# Patient Record
Sex: Male | Born: 1935 | Race: Black or African American | Hispanic: No | Smoking: Former smoker
Health system: Southern US, Community
[De-identification: ages and names within clinical notes are randomized; demographics above are authoritative.]

## PROBLEM LIST (undated history)

## (undated) DIAGNOSIS — M797 Fibromyalgia: Secondary | ICD-10-CM

## (undated) DIAGNOSIS — R350 Frequency of micturition: Secondary | ICD-10-CM

## (undated) DIAGNOSIS — M1990 Unspecified osteoarthritis, unspecified site: Secondary | ICD-10-CM

## (undated) DIAGNOSIS — N189 Chronic kidney disease, unspecified: Secondary | ICD-10-CM

## (undated) DIAGNOSIS — E785 Hyperlipidemia, unspecified: Secondary | ICD-10-CM

## (undated) DIAGNOSIS — I2510 Atherosclerotic heart disease of native coronary artery without angina pectoris: Secondary | ICD-10-CM

## (undated) DIAGNOSIS — I10 Essential (primary) hypertension: Secondary | ICD-10-CM

## (undated) DIAGNOSIS — N400 Enlarged prostate without lower urinary tract symptoms: Secondary | ICD-10-CM

## (undated) DIAGNOSIS — D759 Disease of blood and blood-forming organs, unspecified: Secondary | ICD-10-CM

## (undated) DIAGNOSIS — I519 Heart disease, unspecified: Secondary | ICD-10-CM

## (undated) DIAGNOSIS — C801 Malignant (primary) neoplasm, unspecified: Secondary | ICD-10-CM

## (undated) DIAGNOSIS — K429 Umbilical hernia without obstruction or gangrene: Secondary | ICD-10-CM

## (undated) DIAGNOSIS — R0989 Other specified symptoms and signs involving the circulatory and respiratory systems: Secondary | ICD-10-CM

## (undated) HISTORY — PX: COLECTOMY: SHX59

## (undated) HISTORY — PX: CORONARY ARTERY BYPASS GRAFT: SHX141

## (undated) HISTORY — PX: OTHER SURGICAL HISTORY: SHX169

---

## 2008-01-01 ENCOUNTER — Encounter: Payer: Self-pay | Admitting: Orthopedic Surgery

## 2009-11-20 ENCOUNTER — Ambulatory Visit: Admission: RE | Admit: 2009-11-20 | Discharge: 2010-01-21 | Payer: Self-pay | Admitting: Radiation Oncology

## 2011-09-15 ENCOUNTER — Encounter: Payer: Self-pay | Admitting: Internal Medicine

## 2011-09-15 DIAGNOSIS — N189 Chronic kidney disease, unspecified: Secondary | ICD-10-CM

## 2011-09-15 DIAGNOSIS — C20 Malignant neoplasm of rectum: Secondary | ICD-10-CM

## 2011-10-05 ENCOUNTER — Ambulatory Visit (INDEPENDENT_AMBULATORY_CARE_PROVIDER_SITE_OTHER): Payer: Medicaid Other | Admitting: Urology

## 2011-10-05 DIAGNOSIS — Z125 Encounter for screening for malignant neoplasm of prostate: Secondary | ICD-10-CM

## 2011-10-05 DIAGNOSIS — N390 Urinary tract infection, site not specified: Secondary | ICD-10-CM

## 2011-10-05 DIAGNOSIS — N400 Enlarged prostate without lower urinary tract symptoms: Secondary | ICD-10-CM

## 2011-12-08 DIAGNOSIS — R079 Chest pain, unspecified: Secondary | ICD-10-CM

## 2011-12-09 DIAGNOSIS — I200 Unstable angina: Secondary | ICD-10-CM

## 2011-12-09 DIAGNOSIS — R9389 Abnormal findings on diagnostic imaging of other specified body structures: Secondary | ICD-10-CM

## 2011-12-10 ENCOUNTER — Encounter (HOSPITAL_COMMUNITY)
Admission: AD | Disposition: A | Discharge status: Home / Self Care | Payer: Self-pay | Source: Other Acute Inpatient Hospital | Attending: Internal Medicine

## 2011-12-10 ENCOUNTER — Inpatient Hospital Stay (HOSPITAL_COMMUNITY)
Admission: AD | Admit: 2011-12-10 | Discharge: 2011-12-11 | DRG: 287 | Disposition: A | Discharge status: Home / Self Care | Payer: PRIVATE HEALTH INSURANCE | Source: Other Acute Inpatient Hospital | Attending: Internal Medicine | Admitting: Internal Medicine

## 2011-12-10 ENCOUNTER — Ambulatory Visit (HOSPITAL_COMMUNITY): Admit: 2011-12-10 | Payer: Self-pay | Admitting: Cardiology

## 2011-12-10 ENCOUNTER — Encounter (HOSPITAL_COMMUNITY): Payer: Self-pay | Admitting: General Practice

## 2011-12-10 ENCOUNTER — Other Ambulatory Visit: Payer: Self-pay | Admitting: Physician Assistant

## 2011-12-10 DIAGNOSIS — Z7982 Long term (current) use of aspirin: Secondary | ICD-10-CM

## 2011-12-10 DIAGNOSIS — D72819 Decreased white blood cell count, unspecified: Secondary | ICD-10-CM | POA: Diagnosis present

## 2011-12-10 DIAGNOSIS — I25810 Atherosclerosis of coronary artery bypass graft(s) without angina pectoris: Secondary | ICD-10-CM | POA: Diagnosis present

## 2011-12-10 DIAGNOSIS — I200 Unstable angina: Secondary | ICD-10-CM

## 2011-12-10 DIAGNOSIS — Z79899 Other long term (current) drug therapy: Secondary | ICD-10-CM

## 2011-12-10 DIAGNOSIS — I129 Hypertensive chronic kidney disease with stage 1 through stage 4 chronic kidney disease, or unspecified chronic kidney disease: Secondary | ICD-10-CM | POA: Diagnosis present

## 2011-12-10 DIAGNOSIS — I2582 Chronic total occlusion of coronary artery: Secondary | ICD-10-CM | POA: Diagnosis present

## 2011-12-10 DIAGNOSIS — N400 Enlarged prostate without lower urinary tract symptoms: Secondary | ICD-10-CM | POA: Diagnosis present

## 2011-12-10 DIAGNOSIS — N189 Chronic kidney disease, unspecified: Secondary | ICD-10-CM | POA: Diagnosis present

## 2011-12-10 DIAGNOSIS — Z87891 Personal history of nicotine dependence: Secondary | ICD-10-CM

## 2011-12-10 DIAGNOSIS — I739 Peripheral vascular disease, unspecified: Secondary | ICD-10-CM

## 2011-12-10 DIAGNOSIS — Z85048 Personal history of other malignant neoplasm of rectum, rectosigmoid junction, and anus: Secondary | ICD-10-CM

## 2011-12-10 DIAGNOSIS — F1011 Alcohol abuse, in remission: Secondary | ICD-10-CM | POA: Diagnosis present

## 2011-12-10 DIAGNOSIS — Z932 Ileostomy status: Secondary | ICD-10-CM

## 2011-12-10 DIAGNOSIS — I509 Heart failure, unspecified: Secondary | ICD-10-CM | POA: Diagnosis present

## 2011-12-10 DIAGNOSIS — S68118A Complete traumatic metacarpophalangeal amputation of other finger, initial encounter: Secondary | ICD-10-CM

## 2011-12-10 DIAGNOSIS — D696 Thrombocytopenia, unspecified: Secondary | ICD-10-CM | POA: Diagnosis present

## 2011-12-10 DIAGNOSIS — I2510 Atherosclerotic heart disease of native coronary artery without angina pectoris: Secondary | ICD-10-CM

## 2011-12-10 DIAGNOSIS — I2589 Other forms of chronic ischemic heart disease: Secondary | ICD-10-CM | POA: Diagnosis present

## 2011-12-10 DIAGNOSIS — IMO0001 Reserved for inherently not codable concepts without codable children: Secondary | ICD-10-CM | POA: Diagnosis present

## 2011-12-10 HISTORY — DX: Unspecified osteoarthritis, unspecified site: M19.90

## 2011-12-10 HISTORY — DX: Malignant (primary) neoplasm, unspecified: C80.1

## 2011-12-10 HISTORY — DX: Hyperlipidemia, unspecified: E78.5

## 2011-12-10 HISTORY — DX: Disease of blood and blood-forming organs, unspecified: D75.9

## 2011-12-10 HISTORY — PX: LEFT HEART CATHETERIZATION WITH CORONARY/GRAFT ANGIOGRAM: SHX5450

## 2011-12-10 HISTORY — DX: Other specified symptoms and signs involving the circulatory and respiratory systems: R09.89

## 2011-12-10 HISTORY — DX: Benign prostatic hyperplasia without lower urinary tract symptoms: N40.0

## 2011-12-10 HISTORY — DX: Atherosclerotic heart disease of native coronary artery without angina pectoris: I25.10

## 2011-12-10 HISTORY — DX: Heart disease, unspecified: I51.9

## 2011-12-10 HISTORY — DX: Fibromyalgia: M79.7

## 2011-12-10 HISTORY — DX: Chronic kidney disease, unspecified: N18.9

## 2011-12-10 HISTORY — DX: Essential (primary) hypertension: I10

## 2011-12-10 SURGERY — LEFT HEART CATHETERIZATION WITH CORONARY/GRAFT ANGIOGRAM
Anesthesia: LOCAL

## 2011-12-10 MED ORDER — ACETAMINOPHEN 325 MG PO TABS: 650.0000 mg | ORAL_TABLET | ORAL | Status: DC | PRN

## 2011-12-10 MED ORDER — SODIUM CHLORIDE 0.9 % IJ SOLN: 3.0000 mL | INTRAMUSCULAR | Status: DC | PRN

## 2011-12-10 MED ORDER — SIMVASTATIN 40 MG PO TABS
40.0000 mg | ORAL_TABLET | Freq: Every day | ORAL | Status: DC
  Administered 2011-12-10: 40 mg via ORAL
  Filled 2011-12-10 (×2): qty 1

## 2011-12-10 MED ORDER — SODIUM CHLORIDE 0.9 % IV SOLN: 250.0000 mL | INTRAVENOUS | Status: DC | PRN

## 2011-12-10 MED ORDER — METOPROLOL TARTRATE 25 MG PO TABS
25.0000 mg | ORAL_TABLET | Freq: Two times a day (BID) | ORAL | Status: DC
  Administered 2011-12-10 – 2011-12-11 (×3): 25 mg via ORAL
  Filled 2011-12-10 (×4): qty 1

## 2011-12-10 MED ORDER — ONDANSETRON HCL 4 MG/2ML IJ SOLN: 4.0000 mg | Freq: Four times a day (QID) | INTRAMUSCULAR | Status: DC | PRN

## 2011-12-10 MED ORDER — DIAZEPAM 5 MG PO TABS
5.0000 mg | ORAL_TABLET | ORAL | Status: AC
  Administered 2011-12-10: 5 mg via ORAL
  Filled 2011-12-10: qty 1

## 2011-12-10 MED ORDER — LIDOCAINE HCL (PF) 1 % IJ SOLN
INTRAMUSCULAR | Status: AC
  Filled 2011-12-10: qty 30

## 2011-12-10 MED ORDER — NITROGLYCERIN 0.4 MG SL SUBL: 0.4000 mg | SUBLINGUAL_TABLET | SUBLINGUAL | Status: DC | PRN

## 2011-12-10 MED ORDER — MIDAZOLAM HCL 2 MG/2ML IJ SOLN
INTRAMUSCULAR | Status: AC
  Filled 2011-12-10: qty 2

## 2011-12-10 MED ORDER — HEPARIN (PORCINE) IN NACL 2-0.9 UNIT/ML-% IJ SOLN
INTRAMUSCULAR | Status: AC
  Filled 2011-12-10: qty 2000

## 2011-12-10 MED ORDER — ASPIRIN 81 MG PO CHEW: 324.0000 mg | CHEWABLE_TABLET | ORAL | Status: DC

## 2011-12-10 MED ORDER — SODIUM CHLORIDE 0.9 % IV SOLN
INTRAVENOUS | Status: DC
  Administered 2011-12-10: 23:00:00 via INTRAVENOUS

## 2011-12-10 MED ORDER — SODIUM CHLORIDE 0.9 % IJ SOLN: 3.0000 mL | Freq: Two times a day (BID) | INTRAMUSCULAR | Status: DC

## 2011-12-10 MED ORDER — SODIUM CHLORIDE 0.9 % IV SOLN
INTRAVENOUS | Status: DC
  Administered 2011-12-10: 13:00:00 via INTRAVENOUS

## 2011-12-10 MED ORDER — LORATADINE 10 MG PO TABS
10.0000 mg | ORAL_TABLET | Freq: Every day | ORAL | Status: DC
  Administered 2011-12-10 – 2011-12-11 (×2): 10 mg via ORAL
  Filled 2011-12-10 (×2): qty 1

## 2011-12-10 MED ORDER — NITROGLYCERIN 0.2 MG/ML ON CALL CATH LAB
INTRAVENOUS | Status: AC
  Filled 2011-12-10: qty 1

## 2011-12-10 MED ORDER — TAMSULOSIN HCL 0.4 MG PO CAPS
0.4000 mg | ORAL_CAPSULE | Freq: Two times a day (BID) | ORAL | Status: DC
  Administered 2011-12-10 – 2011-12-11 (×3): 0.4 mg via ORAL
  Filled 2011-12-10 (×4): qty 1

## 2011-12-10 MED ORDER — ASPIRIN EC 81 MG PO TBEC
81.0000 mg | DELAYED_RELEASE_TABLET | Freq: Every day | ORAL | Status: DC
  Administered 2011-12-11: 81 mg via ORAL
  Filled 2011-12-10: qty 1

## 2011-12-10 NOTE — CV Procedure (Signed)
  Cardiac Catheterization Procedure Note  Name: Brian Becker MRN: [id number] DOB: 1935-08-03  Procedure: Left Heart Cath, Selective Coronary Angiography, LV angiography  Indication:   CAD with a distant history of CABG.  He his an ischemic cardiomyopathy.  He has had chest pain and a stress echo that demonstrated LV enlargement and hypokinesis of the anterior and inferior walls.    Procedural details: The right groin was prepped, draped, and anesthetized with 1% lidocaine. Using modified Seldinger technique, a 5 French sheath was introduced into the right femoral artery. Standard Judkins catheters were used for coronary angiography and left ventriculography. I had some difficulty advancing the guidewire in the right iliac artery.  I was able to get past this obstruction with a Glide wire.  A distal aortogram was obtained at the end of the case to evaluate the degree of PVD. Catheter exchanges were performed over a guidewire. There were no immediate procedural complications. The patient was transferred to the post catheterization recovery area for further monitoring.  Procedural Findings:   Hemodynamics:     AO 144/66    LV 152/10   Coronary angiography:   Coronary dominance: Right  Left mainstem:   Normal  Left anterior descending (LAD):   Proximal 25%.  Mid long 50 - 60%.  Distal long 60% and apical long 80%.  D1 and D2 small.  D1 with ostial 80% and long diffuse 50%.  D2 luminal irregularities.  Left circumflex (LCx):  100% after small OM1.  OM2 large and occluded at the ostium.  Fills via SVG.  Right coronary artery (RCA):  100 proximal occlusion.  Grafts:  LIMA to OM:  Widely patent  SVG to RCA:   Occluded  Distal aortogram:  Diffuse nonobstructive plaque.  Left ventriculography: LVEF is estimated at 35% with inferior akinesis, there is no significant mitral regurgitation   Final Conclusions:  Severe native 3 vessel CAD with 1 of 2 bypass grafts patent. Moderately severe  LV dysfunction.  Moderate   Recommendations: Medical management.    Rollene Rotunda 12/10/2011, 4:02 PM

## 2011-12-10 NOTE — H&P (Signed)
NAME:  Brian Becker, Brian Becker ROOM: 424  UNIT NUMBER:  [id number] LOCATION: [id number] 01 ADM/VISIT DATE:  12/08/2011   ADM Vaughan BrownerKathaleen Grinder:  [id number] DOB: 06/25/1935   PRIMARY CARDIOLOGIST:  Lewayne Bunting, M.D. (new).  REFERRING PHYSICIAN:  Wende Crease, M.D., [facility] hospitalist.  REASON FOR CONSULTATION:  Abnormal exercise stress echocardiogram.  HISTORY OF PRESENT ILLNESS:  Brian Becker is a very pleasant 76 year old African American male, with remote history of CABG, but who has not had any subsequent regular cardiology followup.  He relocated to [place] approximately 4 years ago from [place], [place], where he had 2-vessel CABG in 1986.  Patient was admitted yesterday, through the emergency room, following presentation with complaint of exertional angina.  This occurred yesterday morning, sometime after he had gone for a walk.  He referred to it as "tightness" (10/2008), with no radiation to the jaw or upper extremities.  He called EMS, took one NTG, and had a second tablet from EMS, with subsequent complete relief of his symptoms.  He reportedly was pain-free on arrival to the ED, where he presented with a blood pressure of 125/78, pulse 78, and was afebrile.  He was placed on nitro paste, and has not had any recurrent chest discomfort.  One set of cardiac markers was drawn and negative.  Patient was then referred for an exercise stress echocardiogram earlier today, reviewed by Dr. Andee Lineman, during which he reported no chest pain.  The study was adequate (90% PMHR), and patient achieved a level of 4 METS.  He denied any chest pain during exercise, but was found to have ST segment changes.  He also had nonsustained ventricular tachycardia, and echocardiographic images were consistent with ischemia; EF 45% to 50% at rest.  ALLERGIES:  MORPHINE.  HOME MEDICATIONS: 1. Aspirin 81 daily. 2. Atenolol 25 daily. 3. Cetirizine 10 mg p.r.n. 4. Flomax 0.4 mg b.i.d. 5. Lortab 10/500 mg  p.r.n. 6. Maxzide 25/25 mg daily. 7. Pravastatin 40 mg daily. 8. Proscar 5 mg daily.  PAST MEDICAL HISTORY: 1. Multivessel CAD. a. Two-vessel CABG, [address] [place], [place]). 2. HTN. 3. HLD. 4. Rectal adenocarcinoma (T3N0M0). a. Status post low anterior resection/temporary loop ileostomy, 2011 [facility]). b. Status post preoperative chemoradiation therapy. 5. Mild leukopenia. 6. Chronic renal medical disease. 7. Mild thrombocytopenia. 8. BPH.  PAST SURGICAL HISTORY:  CABG, left thoracic outlet surgery, right middle finger amputation (infection), colon surgery (as noted), and alcohol abuse.  SOCIAL HISTORY:  Patient relocated here to [facility] approximately 4 years ago.  He lives alone.  He is retired from a Technical sales engineer.  He quit smoking approximately 2 years ago, and started at the age of 31.  Quit drinking 4 years ago, with prior history of heavy use.  FAMILY HISTORY:  Noncontributory.  REVIEW OF SYSTEMS:  Denies any recent development of exertional angina or dyspnea, orthopnea, PND, tachy palpitations, or reflux symptoms.  Has had some mild chronic edema.  Denies history of diabetes mellitus.  Remaining systems reviewed are negative.  PHYSICAL EXAMINATION:  Vital signs:  Blood pressure currently 119/71, pulse ranging 65-85, regular; respirations 20, temperature afebrile, sats 96% on room air.  Weight 211 pounds.  General:  A 76 year old male sitting upright, in no distress.  HEENT:  Normocephalic, atraumatic.  PERRLA.  EOMI.  Neck:  Palpable bilateral carotid pulses with soft bilateral  bruits (right greater than left); no JVD.  Lungs:  Clear to auscultation in all fields.  Heart:  Regular rate and rhythm.  Soft,  grade 2/6 short systolic ejection murmur at the base.  Abdomen:  Protuberant.  Intact bowel sounds.  Extremities:  Palpable bilateral femoral pulses with soft right-sided bruits; palpable bilateral dorsalis pedis pulses; no significant peripheral edema.  Skin:   Warm and dry.  Musculoskeletal:  No obvious deformity.  Neurologic:  No focal deficits.  IMAGING DATA: 1. Admission chest x-ray:  Post CABG changes; no pleural effusion. 2. Admission EKG:  Normal sinus rhythm at 64 BPM; normal axis; LVH strain, isolated PVCs.  LABORATORY DATA:  CPK 132/2.1, troponin I 0.02, BNP 70, INR 1.1.  Sodium 138, potassium 3.8, BUN 12, creatinine 1.1, glucose 126.  WBC 2700, hemoglobin 13, hematocrit 41, platelet count 124,000.  IMPRESSION: 1. Unstable angina pectoris. a. Abnormal adequate exercise stress echocardiogram study b. Ejection fraction 45% to 50%. 2. Multivessel coronary artery disease. a. Two-vessel coronary artery bypass graft, 1986 [place], [place] 3. Ventricular ectopy. 4. Hypertension. 5. History of tobacco. 6. Carotid bruits, bilateral. 7. History of ethanol abuse.  PLAN:  Recommendation is to proceed with diagnostic coronary angiography and possible percutaneous intervention.  Arrangements will be made for the patient to be transferred in the morning to the cardiac catheterization lab.  Patient is in agreement with this recommendation, and the risks/benefits were discussed in conjunction with Dr. Andee Lineman.  Following hospitalization, recommendation is that the patient establish with [place] here in the [facility] clinic for regular cardiology follow up.  Please refer to Dr. Margarita Mail addendum note for complete details.  Brian Becker, P.A., dictating for Lewayne Bunting, M.D.   __________________________    Brian Becker, P.A. Brian Becker D: 12/09/2011 1739 T: 12/10/2011 0153 P: SER2  The patient understands that risks included but are not limited to stroke (1 in 1000), death (1 in 1000), kidney failure [usually temporary] (1 in 500), bleeding (1 in 200), allergic reaction [possibly serious] (1 in 200).  The patient understands and agrees to proceed.   I have reviewed the findings above and examined the patient and agree.  Cath indicated.

## 2011-12-11 ENCOUNTER — Encounter (HOSPITAL_COMMUNITY): Payer: Self-pay | Admitting: Cardiology

## 2011-12-11 DIAGNOSIS — I200 Unstable angina: Secondary | ICD-10-CM

## 2011-12-11 LAB — BASIC METABOLIC PANEL
BUN: 13 mg/dL (ref 6–23)
Chloride: 108 mEq/L (ref 96–112)
Creatinine, Ser: 1.09 mg/dL (ref 0.50–1.35)
GFR calc Af Amer: 75 mL/min — ABNORMAL LOW (ref >90)
GFR calc non Af Amer: 65 mL/min — ABNORMAL LOW (ref >90)
Glucose, Bld: 94 mg/dL (ref 70–99)

## 2011-12-11 LAB — LIPID PANEL
Cholesterol: 127 mg/dL (ref 0–200)
Total CHOL/HDL Ratio: 4.1 RATIO
VLDL: 21 mg/dL (ref 0–40)

## 2011-12-11 MED ORDER — NITROGLYCERIN 0.4 MG SL SUBL: 0.4000 mg | SUBLINGUAL_TABLET | SUBLINGUAL | Status: DC | PRN

## 2011-12-11 MED ORDER — RAMIPRIL 2.5 MG PO CAPS: 2.5000 mg | ORAL_CAPSULE | Freq: Every day | ORAL | Status: DC

## 2011-12-11 MED ORDER — RAMIPRIL 2.5 MG PO CAPS
2.5000 mg | ORAL_CAPSULE | Freq: Every day | ORAL | Status: DC
  Administered 2011-12-11: 2.5 mg via ORAL
  Filled 2011-12-11: qty 1

## 2011-12-11 MED ORDER — METOPROLOL TARTRATE 25 MG PO TABS: 25.0000 mg | ORAL_TABLET | Freq: Two times a day (BID) | ORAL | Status: DC

## 2011-12-11 NOTE — Discharge Summary (Signed)
Discharge Summary   Patient ID: Brian Becker MRN: [id number], DOB/AGE: 74-Jul-1937 74 y.o.  Primary MD:  Primary Cardiologist: Dr. Andee Lineman Admit date: 12/10/2011 D/C date:     12/11/2011      Primary Discharge Diagnoses:  1. Unstable Angina/Coronary Artery Disease  - H/o 2v CABG 1986  - Cath 12/10/11 severe native 3v CAD with patent LAD to LIMA and occluded SVG to RCA, EF 35% w/ inf akinesis; medical therapy  2. LV dysfunction  - EF 35% by cath  - Low dose ACEI added this admission  - Consider follow up BMET  3. Bilateral Carotid Bruits  - Recommend follow up carotid dopplers  Secondary Discharge Diagnoses:  1. HTN.  2. HLD 3. Chronic renal disease 4. Rectal adenocarcinoma (T3N0M0). s/p low anterior resection/temporary loop ileostomy, 2011 [facility]) & preoperative chemoradiation therapy 5. Mild leukopenia.  6. Mild thrombocytopenia.  7. BPH 8. H/o ETOH abuse -  Quit 2009 9. H/o tobacco abuse - quit 2011  10. left thoracic outlet surgery 11. 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 Fibromyalgia   Allergies Allergies  Allergen Reactions  . Morphine And Related Nausea And Vomiting    Diagnostic Studies/Procedures:   12/10/11 - Cardiac Cath Hemodynamics:  AO 144/66  LV 152/10  Coronary angiography:  Coronary dominance: Right  Left mainstem: Normal  Left anterior descending (LAD): Proximal 25%. Mid long 50 - 60%. Distal long 60% and apical long 80%. D1 and D2 small. D1 with ostial 80% and long diffuse 50%. D2 luminal irregularities.  Left circumflex (LCx): 100% after small OM1. OM2 large and occluded at the ostium. Fills via SVG.  Right coronary artery (RCA): 100 proximal occlusion.  Grafts:  LIMA to OM: Widely patent  SVG to RCA: Occluded  Distal aortogram: Diffuse nonobstructive plaque.  Left ventriculography: LVEF is estimated at 35% with inferior akinesis, there is no significant mitral regurgitation  Final Conclusions: Severe native 3  vessel CAD with 1 of 2 bypass grafts patent. Moderately severe LV dysfunction. Moderate  Recommendations: Medical management.   History of Present Illness: 77 y.o. male w/ the above medical problems who transferred from [facility] to [facility] on 12/10/11 for abnormal stress echo and need for cardiac catheterization.  Patient complained of exertional chest pain on morning prior to presentation. He referred to it as "tightness".. He called EMS, took one NTG, and had a second tablet from EMS, with subsequent complete relief of his symptoms.   Hospital Course: At [facility] EKG revealed NSR 64bpm, no acute ischemic changes. CXR was without acute cardiopulmonary abnormalities. Labs were significant for normal cardiac enzymes. He underwent stress echo with (+) ST segment changes and NSVT with echo images consistent with ischemia, EF 45-50% at rest. He was transferred to [facility] for further evaluation and treatment.   Cardiac cath on 12/10/11 revealed severe native 3v CAD with patent LAD to LIMA and occluded SVG to RCA, EF 35% w/ inf akinesis. He tolerated the procedure well without complications. Recommendations were made for continued medical management. On day of discharge he had no complaints of chest pain or sob. Cath site was stable. He was continued on his home medications and started on low dose ACEI. He was seen and evaluated by Dr. Ladona Ridgel who felt he was stable for discharge home with plans for follow up as scheduled below. Note there was mention of bilateral carotid bruits on H&P from [facility] for which it is recommended he have follow up carotid  dopplers. Discharge weight 200lbs.  Discharge Vitals: Blood pressure 144/75, pulse 54, temperature 98.7 F (37.1 C), temperature source Oral, resp. rate 18, height 5\' 6"  (1.676 m), weight 200 lb (90.719 kg), SpO2 97.00%.  Labs: [facility]: CPK 132/2.1, troponin I 0.02, BNP 70, INR 1.1. Sodium 138, potassium 3.8,  BUN 12, creatinine 1.1, glucose 126. WBC 2700, hemoglobin 13, hematocrit 41, platelet count 124,000.  Lab 12/11/11 0525  NA 141  K 3.9  CL 108  CO2 24  BUN 13  CREATININE 1.09  CALCIUM 9.0  GLUCOSE 94   Component Value Date   CHOL 127 12/11/2011   HDL 31* 12/11/2011   LDLCALC 75 12/11/2011   TRIG 104 12/11/2011    Discharge Medications   Medication List  As of 12/11/2011  3:27 PM   STOP taking these medications         atenolol 25 MG tablet      potassium chloride 10 MEQ tablet      triamterene-hydrochlorothiazide 37.5-25 MG per tablet         TAKE these medications         aspirin EC 81 MG tablet   Take 81 mg by mouth daily.      capecitabine 500 MG tablet   Commonly known as: XELODA   Take 500 mg by mouth daily.      cetirizine 10 MG tablet   Commonly known as: ZYRTEC   Take 10 mg by mouth daily.      finasteride 5 MG tablet   Commonly known as: PROSCAR   Take 5 mg by mouth daily.      HYDROcodone-acetaminophen 10-500 MG per tablet   Commonly known as: LORTAB   Take 1 tablet by mouth every 6 (six) hours as needed. For pain      metoprolol tartrate 25 MG tablet   Commonly known as: LOPRESSOR   Take 1 tablet (25 mg total) by mouth 2 (two) times daily.      nitroGLYCERIN 0.4 MG SL tablet   Commonly known as: NITROSTAT   Place 1 tablet (0.4 mg total) under the tongue every 5 (five) minutes as needed for chest pain (up to 3 doses).      pravastatin 40 MG tablet   Commonly known as: PRAVACHOL   Take 40 mg by mouth daily.      ramipril 2.5 MG capsule   Commonly known as: ALTACE   Take 1 capsule (2.5 mg total) by mouth daily.      Tamsulosin HCl 0.4 MG Caps   Commonly known as: FLOMAX   Take 0.4 mg by mouth 2 (two) times daily.            Disposition   Discharge Orders    Future Orders Please Complete By Expires   Diet - low sodium heart healthy      Increase activity slowly      Discharge instructions      Comments:   **PLEASE REMEMBER TO  BRING ALL OF YOUR MEDICATIONS TO EACH OF YOUR FOLLOW-UP OFFICE VISITS.  * KEEP GROIN SITE CLEAN AND DRY. Call the office for any signs of bleedings, pus, swelling, increased pain, or any other concerns. * NO HEAVY LIFTING (>10lbs) OR SEXUAL ACTIVITY X 7 DAYS. * NO DRIVING X 2-3 DAYS. * NO SOAKING BATHS, HOT TUBS, POOLS, ETC., X 7 DAYS.     Follow-up Information    Follow up with Peyton Bottoms, MD. (Our office will call you with an  appointment time)    Contact information:   [facility] [address] [place] [address] [phone]       Follow up with Your Primary Care Provider. (As needed)           Outstanding Labs/Studies:  1. BMET 2. Carotid Dopplers  Duration of Discharge Encounter: Greater than 30 minutes including physician and PA time.  Signed, Dominie Benedick PA-C 12/11/2011, 3:27 PM

## 2011-12-11 NOTE — Progress Notes (Signed)
Patient ID: Brian Becker, male   DOB: August 05, 1935, 76 y.o.   MRN: [id number] Subjective:  Catheterization results noted. Ok for discharge. No chest pain or sob.  Objective:  Vital Signs in the last 24 hours: Temp:  [98.3 F (36.8 C)-98.6 F (37 C)] 98.3 F (36.8 C) (08/10 0500) Pulse Rate:  [64-85] 69  (08/10 0500) Resp:  [18] 18  (08/10 0500) BP: (118-134)/(63-112) 121/72 mmHg (08/10 0500) SpO2:  [99 %-100 %] 100 % (08/10 0500)  Intake/Output from previous day: 08/09 0701 - 08/10 0700 In: -  Out: 400 [Urine:400] Intake/Output from this shift: Total I/O In: 360 [P.O.:360] Out: 175 [Urine:175]  Physical Exam: Well appearing NAD HEENT: Unremarkable Neck:  No JVD, no thyromegally Lungs:  Clear with no wheezes HEART:  Regular rate rhythm, no murmurs, no rubs, no clicks Abd:  Flat, positive bowel sounds, no organomegally, no rebound, no guarding Ext:  2 plus pulses, no edema, no cyanosis, no clubbing Skin:  No rashes no nodules Neuro:  CN II through XII intact, motor grossly intact  Lab Results: No results found for this basename: WBC:2,HGB:2,PLT:2 in the last 72 hours  Basename 12/11/11 0525  NA 141  K 3.9  CL 108  CO2 24  GLUCOSE 94  BUN 13  CREATININE 1.09   No results found for this basename: TROPONINI:2,CK,MB:2 in the last 72 hours Hepatic Function Panel No results found for this basename: PROT,ALBUMIN,AST,ALT,ALKPHOS,BILITOT,BILIDIR,IBILI in the last 72 hours  Basename 12/11/11 0525  CHOL 127   No results found for this basename: PROTIME in the last 72 hours  Imaging: No results found.  Cardiac Studies: Tele - nsr Assessment/Plan:  1. Chest pain 2. CAD,s/p CABG 3. HTN 4. Class 1-2 CHF Rec: ok to discharge. I have started low dose Ramipril. Continue beta blocker. Usual followup with primary MD.   LOS: 1 day    Lewayne Bunting, M.D. 12/11/2011, 10:39 AM

## 2011-12-27 ENCOUNTER — Encounter: Payer: PRIVATE HEALTH INSURANCE | Admitting: Physician Assistant

## 2012-01-17 ENCOUNTER — Encounter: Payer: PRIVATE HEALTH INSURANCE | Admitting: Physician Assistant

## 2012-03-28 ENCOUNTER — Encounter: Payer: PRIVATE HEALTH INSURANCE | Admitting: Internal Medicine

## 2012-04-28 ENCOUNTER — Encounter: Payer: PRIVATE HEALTH INSURANCE | Admitting: Internal Medicine

## 2012-05-19 DIAGNOSIS — G4700 Insomnia, unspecified: Secondary | ICD-10-CM

## 2012-05-19 DIAGNOSIS — C20 Malignant neoplasm of rectum: Secondary | ICD-10-CM

## 2012-07-24 ENCOUNTER — Other Ambulatory Visit (HOSPITAL_COMMUNITY): Payer: Self-pay | Admitting: Cardiology

## 2012-07-24 ENCOUNTER — Telehealth: Payer: Self-pay

## 2012-07-24 MED ORDER — RAMIPRIL 2.5 MG PO CAPS: 2.5000 mg | ORAL_CAPSULE | Freq: Every day | ORAL | Status: DC

## 2012-07-24 NOTE — Telephone Encounter (Signed)
s/w pt about ramipril. cath 12/2011 with Dr.Hochrein pt was suppose to folow up with Dr. Andee Lineman.pt did not, pt aware refill sent in. [place] office will call pt to make appt.spoke with vicki [place] office will call pt for appt

## 2012-07-31 ENCOUNTER — Encounter: Payer: PRIVATE HEALTH INSURANCE | Admitting: Physician Assistant

## 2012-08-24 ENCOUNTER — Other Ambulatory Visit: Payer: Self-pay | Admitting: Internal Medicine

## 2012-10-10 ENCOUNTER — Ambulatory Visit (INDEPENDENT_AMBULATORY_CARE_PROVIDER_SITE_OTHER): Payer: PRIVATE HEALTH INSURANCE | Admitting: Urology

## 2012-10-10 DIAGNOSIS — N529 Male erectile dysfunction, unspecified: Secondary | ICD-10-CM

## 2012-10-10 DIAGNOSIS — N400 Enlarged prostate without lower urinary tract symptoms: Secondary | ICD-10-CM

## 2012-10-10 DIAGNOSIS — R310 Gross hematuria: Secondary | ICD-10-CM

## 2012-12-11 ENCOUNTER — Other Ambulatory Visit (HOSPITAL_COMMUNITY): Payer: Self-pay | Admitting: Cardiology

## 2013-01-31 DIAGNOSIS — Z85038 Personal history of other malignant neoplasm of large intestine: Secondary | ICD-10-CM

## 2013-01-31 DIAGNOSIS — I10 Essential (primary) hypertension: Secondary | ICD-10-CM

## 2013-06-19 ENCOUNTER — Encounter (HOSPITAL_COMMUNITY): Payer: Self-pay | Admitting: Pharmacy Technician

## 2013-06-21 ENCOUNTER — Ambulatory Visit (HOSPITAL_COMMUNITY)
Admission: RE | Admit: 2013-06-21 | Discharge: 2013-06-21 | Disposition: A | Discharge status: Home / Self Care | Payer: PRIVATE HEALTH INSURANCE | Source: Ambulatory Visit | Attending: Anesthesiology | Admitting: Anesthesiology

## 2013-06-21 ENCOUNTER — Encounter (HOSPITAL_COMMUNITY)
Admission: RE | Admit: 2013-06-21 | Discharge: 2013-06-21 | Disposition: A | Discharge status: Home / Self Care | Payer: PRIVATE HEALTH INSURANCE | Source: Ambulatory Visit | Attending: Oral Surgery | Admitting: Oral Surgery

## 2013-06-21 ENCOUNTER — Encounter (HOSPITAL_COMMUNITY): Payer: Self-pay

## 2013-06-21 DIAGNOSIS — Z01810 Encounter for preprocedural cardiovascular examination: Secondary | ICD-10-CM | POA: Insufficient documentation

## 2013-06-21 DIAGNOSIS — Z01812 Encounter for preprocedural laboratory examination: Secondary | ICD-10-CM | POA: Insufficient documentation

## 2013-06-21 DIAGNOSIS — Z01818 Encounter for other preprocedural examination: Secondary | ICD-10-CM | POA: Insufficient documentation

## 2013-06-21 HISTORY — DX: Frequency of micturition: R35.0

## 2013-06-21 HISTORY — DX: Benign prostatic hyperplasia without lower urinary tract symptoms: N40.0

## 2013-06-21 HISTORY — DX: Umbilical hernia without obstruction or gangrene: K42.9

## 2013-06-21 LAB — COMPREHENSIVE METABOLIC PANEL
ALBUMIN: 3.8 g/dL (ref 3.5–5.2)
ALK PHOS: 68 U/L (ref 39–117)
ALT: 13 U/L (ref 0–53)
AST: 14 U/L (ref 0–37)
BUN: 8 mg/dL (ref 6–23)
CALCIUM: 9.8 mg/dL (ref 8.4–10.5)
CO2: 27 mEq/L (ref 19–32)
Chloride: 101 mEq/L (ref 96–112)
Creatinine, Ser: 1.12 mg/dL (ref 0.50–1.35)
GFR calc non Af Amer: 62 mL/min — ABNORMAL LOW (ref >90)
GFR, EST AFRICAN AMERICAN: 72 mL/min — AB (ref >90)
GLUCOSE: 86 mg/dL (ref 70–99)
Potassium: 4.2 mEq/L (ref 3.7–5.3)
Sodium: 138 mEq/L (ref 137–147)
TOTAL PROTEIN: 7.6 g/dL (ref 6.0–8.3)
Total Bilirubin: 0.3 mg/dL (ref 0.3–1.2)

## 2013-06-21 LAB — CBC
HCT: 40.1 % (ref 39.0–52.0)
HEMOGLOBIN: 13.4 g/dL (ref 13.0–17.0)
MCH: 28.5 pg (ref 26.0–34.0)
MCHC: 33.4 g/dL (ref 30.0–36.0)
MCV: 85.3 fL (ref 78.0–100.0)
Platelets: 141 10*3/uL — ABNORMAL LOW (ref 150–400)
RBC: 4.7 MIL/uL (ref 4.22–5.81)
RDW: 13.6 % (ref 11.5–15.5)
WBC: 3.5 10*3/uL — ABNORMAL LOW (ref 4.0–10.5)

## 2013-06-21 NOTE — Pre-Procedure Instructions (Signed)
Brian Becker  06/21/2013   Your procedure is scheduled on:  Tues, Feb 24 @ 8:45 AM  Report to [facility] Stay Entrance A  at 5:30 AM.  Call this number if you have problems the morning of surgery: [phone]   Remember:   Do not eat food or drink liquids after midnight.   Take these medicines the morning of surgery with A SIP OF WATER: Atenolol(Tenormin),Zyrtec(Cetirizine),Proscar(Finasteride),Flonase(Fluticasone),Combivent<Bring Your Inhaler With Your Inhaler With You>,Pain Pill(if needed),and Flomax(Tamsulosin)               No Goody's,BC's,Aleve,Aspirin,Ibuprofen,Fish Oil,or any Herbal Medications   Do not wear jewelry.  Do not wear lotions, powders, or colognes. You may wear deodorant.  Men may shave face and neck.  Do not bring valuables to the hospital.  [facility] is not responsible                  for any belongings or valuables.               Contacts, dentures or bridgework may not be worn into surgery.  Leave suitcase in the car. After surgery it may be brought to your room.  For patients admitted to the hospital, discharge time is determined by your                treatment team.               Patients discharged the day of surgery will not be allowed to drive  home.    Special Instructions:  Brian Becker - Preparing for Surgery  Before surgery, you can play an important role.  Because skin is not sterile, your skin needs to be as free of germs as possible.  You can reduce the number of germs on you skin by washing with CHG (chlorahexidine gluconate) soap before surgery.  CHG is an antiseptic cleaner which kills germs and bonds with the skin to continue killing germs even after washing.  Please DO NOT use if you have an allergy to CHG or antibacterial soaps.  If your skin becomes reddened/irritated stop using the CHG and inform your nurse when you arrive at Short Stay.  Do not shave (including legs and underarms) for at least 48 hours prior to the first CHG  shower.  You may shave your face.  Please follow these instructions carefully:   1.  Shower with CHG Soap the night before surgery and the                                morning of Surgery.  2.  If you choose to wash your hair, wash your hair first as usual with your       normal shampoo.  3.  After you shampoo, rinse your hair and body thoroughly to remove the                      Shampoo.  4.  Use CHG as you would any other liquid soap.  You can apply chg directly       to the skin and wash gently with scrungie or a clean washcloth.  5.  Apply the CHG Soap to your body ONLY FROM THE NECK DOWN.        Do not use on open wounds or open sores.  Avoid contact with your eyes,       ears, mouth and  genitals (private parts).  Wash genitals (private parts)       with your normal soap.  6.  Wash thoroughly, paying special attention to the area where your surgery        will be performed.  7.  Thoroughly rinse your body with warm water from the neck down.  8.  DO NOT shower/wash with your normal soap after using and rinsing off       the CHG Soap.  9.  Pat yourself dry with a clean towel.            10.  Wear clean pajamas.            11.  Place clean sheets on your bed the night of your first shower and do not        sleep with pets.  Day of Surgery  Do not apply any lotions/deoderants the morning of surgery.  Please wear clean clothes to the hospital/surgery center.     Please read over the following fact sheets that you were given: Pain Booklet, Coughing and Deep Breathing and Surgical Site Infection Prevention

## 2013-06-21 NOTE — Progress Notes (Signed)
Anesthesia Note:  Patient is a 78 year old male scheduled for multiple teeth extractions with alveoloplasty on 06/26/13 by Dr. Hoyt Koch. His PAT RN told him to wait for me to talk with him at his PAT visit, but he said radiology staff told him that could leave after his CXR, so he did.  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  BMI is 34.75, consistent with obesity.  PCP is Dr. Wenda Overland in [place], [place].    Patient is not actively being followed by a cardiologist.  He was seen by Dr. Minus Breeding in 12/2011 after an admission for chest pain to [facility]) with transfer to [facility] for cardiac catheterization (see below).  He remains on ACEI and b-blocker therapy. Dr. Wenda Overland medically cleared patient following a visit on 05/24/2013.  His notes don't mention any of the cardiac tests done in 2013, but it does state that patient was able to walk 40 minutes five times a week at that appointment.   Since I missed him at his PAT visit, I called and spoke with him via telephone. He denied any current chest pain, but reported use of Nitroglycerin ~ 2 weeks ago.  He can't remember all of the details, but thinks he was lying down.  He described pain as "mild discomfort" in his mid chest that lasted approximately 5 minutes.  There was no associated symptoms such as diaphoresis, nausea, palpitations, SOB, jaw or arm pain.  Pain was non-radiating.  He didn't think much of his pain but took a Nitro just  to be safe.  He has no had any recurrent pain.  He has not walked his usual 40 minutes exercises within the past few weeks because he's being "a little lazy"--not due to chest pain or SOB.  He is able to walk to his local store which is about one block each way without any CV symptoms.  He doesn't take stairs on a regular basis. He denies SOB/DOE, palpitations, edema, weight gain, or edema.       EKG on 06/21/13 showed SB @ 58 bpm with sinus arrhythmia, first degree AVB, cannot rule out anterior infarct (age undetermined), T wave abnormality (consider inferolateral ischemia).  T wave abnormality less pronounced then on his prior EKG on 07/20/08 from [facility].  Cardiac cath on 12/10/11 showed:  Coronary dominance: Right  Left mainstem: Normal  Left anterior descending (LAD): Proximal 25%. Mid long 50-60%. Distal long 60% and apical long 80%. D1 and D2 small. D1 with ostial 80% and long diffuse 50%. D2 luminal irregularities.  Left circumflex (LCx): 100% after small OM1. OM2 large and occluded at the ostium. Fills via SVG.  Right coronary artery (RCA): 100 proximal occlusion.  Grafts: LIMA to OM: Widely patent. SVG to RCA: Occluded.  Distal aortogram: Diffuse nonobstructive plaque.  Left ventriculography: LVEF is estimated at 35% with inferior akinesis, there is no significant mitral regurgitation  Final Conclusions: Severe native 3 vessel CAD with 1 of 2 bypass grafts patent. Moderately severe LV dysfunction.  Recommendations: Medical management.   Stress echo on 12/09/11 showed: Positive electrocardiographic stress chest with exercise induced nonsustained ventricular tachycardia. Left ventricular dilation stress and LV dysfunction with anterior and inferior hypokinesis.  CXR on 06/21/13 showed: Hyperexpanded lungs without acute cardiopulmonary disease.  Preoperative labs noted.  PLT 141K.  AST/ALT WNL. H/H 13.4/40.1.  Subjectively, he does not have any CHF symptoms.  CXR showed no edema.  He does have  known CAD, but with cath less than two years ago with medical therapy recommended. He was recently medically cleared by his PCP.  He did take Nitro X 1 approximately two weeks ago, he believes when laying down, but denies any recurrent symptoms or exertional chest pain.  I reviewed with anesthesiologist Dr. Tamala Julian.  Patient will be further evaluated on the day of surgery, but if no recurrent symptoms or other acute changes then it is anticipated that he can proceed as planned.    George Hugh [facility]/Anesthesiology Phone [phone] 06/22/2013 10:38 AM

## 2013-06-21 NOTE — H&P (Signed)
HISTORY AND PHYSICAL  Raydell Maners is a 78 y.o. male patient with CC: painful teeth  No diagnosis found.  Past Medical History  Diagnosis Date  . Coronary artery disease     2v CABG 1986; Cath 12/10/11 severe native 3v CAD with patent LAD to LIMA and occluded SVG to RCA, EF 35% w/ inf akinesis; medical therapy  . Hypertension   . Arthritis   . Fibromyalgia   . Hyperlipidemia   . Chronic kidney disease   . BPH (benign prostatic hyperplasia)   . Blood dyscrasia     mild thrombocytopenia  . Cancer     rectal adenocarcinoma  . LV dysfunction     EF 35% by cath 12/2011  . Carotid bruit     bilat    No current facility-administered medications for this encounter.   Current Outpatient Prescriptions  Medication Sig Dispense Refill  . aspirin EC 81 MG tablet Take 81 mg by mouth daily.      Marland Kitchen atenolol (TENORMIN) 25 MG tablet Take 25 mg by mouth 2 (two) times daily.      . cetirizine (ZYRTEC) 10 MG tablet Take 10 mg by mouth daily.      . cholecalciferol (VITAMIN D) 1000 UNITS tablet Take 1,000 Units by mouth daily.      . Diphenhydramine-Zinc Acetate (BENADRYL EX) Apply 1 application topically 2 (two) times daily as needed (itching).      . finasteride (PROSCAR) 5 MG tablet Take 5 mg by mouth daily.      . fluticasone (FLONASE) 50 MCG/ACT nasal spray Place 1 spray into both nostrils daily as needed (congestion).      . Ipratropium-Albuterol (COMBIVENT RESPIMAT) 20-100 MCG/ACT AERS respimat Inhale 1 puff into the lungs daily as needed for wheezing.      . nitroGLYCERIN (NITROSTAT) 0.4 MG SL tablet Place 0.4 mg under the tongue every 5 (five) minutes as needed for chest pain.      Marland Kitchen OVER THE COUNTER MEDICATION Apply 1 application topically daily as needed (itching). Equate extra strength anti itch cream      . OVER THE COUNTER MEDICATION Place 1 drop into both eyes daily as needed (red/ dry eyes). Equate redness reliever eye drops      . oxyCODONE-acetaminophen (PERCOCET) 10-325 MG per  tablet Take 1 tablet by mouth every 6 (six) hours as needed for pain.      . ramipril (ALTACE) 2.5 MG capsule Take 2.5 mg by mouth daily.      . simvastatin (ZOCOR) 40 MG tablet Take 40 mg by mouth at bedtime.      . Tamsulosin HCl (FLOMAX) 0.4 MG CAPS Take 0.4 mg by mouth 2 (two) times daily.        Allergies  Allergen Reactions  . Morphine And Related Nausea And Vomiting   Active Problems:   * No active hospital problems. *  Vitals: There were no vitals taken for this visit. Lab results:No results found for this or any previous visit (from the past 46 hour(s)). Radiology Results: No results found. General appearance: alert, cooperative and no distress Head: Normocephalic, without obvious abnormality, atraumatic Eyes: negative Nose: Nares normal. Septum midline. Mucosa normal. No drainage or sinus tenderness. Throat: Multiple severe dental caries. Severe generalized periodontitis. Neck: no adenopathy, supple, symmetrical, trachea midline and thyroid not enlarged, symmetric, no tenderness/mass/nodules Resp: clear to auscultation bilaterally Cardio: regular rate and rhythm, S1, S2 normal, no murmur, click, rub or gallop  Assessment: Non-restorable teeth.   Plan:  Full mouth extractions. Alveoloplasty. General anesthesia. Day surgery.   Gae Bon 06/21/2013

## 2013-06-21 NOTE — Progress Notes (Addendum)
Hasn't seen a cardiologist in over 59yrs  Stress test done at least 23yrs ago  Unsure if hes ever had an echo  Heart cath report in epic from 2013  Denies EKG or CXR in past yr  Medical MD is Dr.Bluth   States last time used Nitro was 2wks ago or little less

## 2013-06-22 ENCOUNTER — Encounter (HOSPITAL_COMMUNITY): Payer: Self-pay

## 2013-06-25 MED ORDER — CEFAZOLIN SODIUM-DEXTROSE 2-3 GM-% IV SOLR
2.0000 g | INTRAVENOUS | Status: DC
  Filled 2013-06-25: qty 50

## 2013-06-26 ENCOUNTER — Encounter (HOSPITAL_COMMUNITY): Payer: PRIVATE HEALTH INSURANCE | Admitting: Vascular Surgery

## 2013-06-26 ENCOUNTER — Encounter (HOSPITAL_COMMUNITY)
Admission: RE | Disposition: A | Discharge status: Home / Self Care | Payer: Self-pay | Source: Ambulatory Visit | Attending: Oral Surgery

## 2013-06-26 ENCOUNTER — Ambulatory Visit (HOSPITAL_COMMUNITY)
Admission: RE | Admit: 2013-06-26 | Discharge: 2013-06-26 | Disposition: A | Discharge status: Home / Self Care | Payer: PRIVATE HEALTH INSURANCE | Source: Ambulatory Visit | Attending: Oral Surgery | Admitting: Oral Surgery

## 2013-06-26 ENCOUNTER — Encounter (HOSPITAL_COMMUNITY): Payer: Self-pay | Admitting: Anesthesiology

## 2013-06-26 ENCOUNTER — Ambulatory Visit (HOSPITAL_COMMUNITY): Payer: PRIVATE HEALTH INSURANCE | Admitting: Anesthesiology

## 2013-06-26 DIAGNOSIS — R0989 Other specified symptoms and signs involving the circulatory and respiratory systems: Secondary | ICD-10-CM | POA: Diagnosis not present

## 2013-06-26 DIAGNOSIS — I129 Hypertensive chronic kidney disease with stage 1 through stage 4 chronic kidney disease, or unspecified chronic kidney disease: Secondary | ICD-10-CM | POA: Insufficient documentation

## 2013-06-26 DIAGNOSIS — IMO0001 Reserved for inherently not codable concepts without codable children: Secondary | ICD-10-CM | POA: Insufficient documentation

## 2013-06-26 DIAGNOSIS — E785 Hyperlipidemia, unspecified: Secondary | ICD-10-CM | POA: Diagnosis not present

## 2013-06-26 DIAGNOSIS — Z7982 Long term (current) use of aspirin: Secondary | ICD-10-CM | POA: Diagnosis not present

## 2013-06-26 DIAGNOSIS — K0530 Chronic periodontitis, unspecified: Secondary | ICD-10-CM | POA: Diagnosis not present

## 2013-06-26 DIAGNOSIS — I2510 Atherosclerotic heart disease of native coronary artery without angina pectoris: Secondary | ICD-10-CM | POA: Insufficient documentation

## 2013-06-26 DIAGNOSIS — Z951 Presence of aortocoronary bypass graft: Secondary | ICD-10-CM | POA: Insufficient documentation

## 2013-06-26 DIAGNOSIS — J320 Chronic maxillary sinusitis: Secondary | ICD-10-CM | POA: Insufficient documentation

## 2013-06-26 DIAGNOSIS — K029 Dental caries, unspecified: Secondary | ICD-10-CM | POA: Diagnosis present

## 2013-06-26 DIAGNOSIS — N189 Chronic kidney disease, unspecified: Secondary | ICD-10-CM | POA: Diagnosis not present

## 2013-06-26 HISTORY — PX: MULTIPLE EXTRACTIONS WITH ALVEOLOPLASTY: SHX5342

## 2013-06-26 SURGERY — MULTIPLE EXTRACTION WITH ALVEOLOPLASTY
Anesthesia: General | Site: Mouth

## 2013-06-26 MED ORDER — LACTATED RINGERS IV SOLN
INTRAVENOUS | Status: DC
  Administered 2013-06-26: 07:00:00 via INTRAVENOUS

## 2013-06-26 MED ORDER — HYDROMORPHONE HCL PF 1 MG/ML IJ SOLN
INTRAMUSCULAR | Status: AC
  Filled 2013-06-26: qty 1

## 2013-06-26 MED ORDER — SODIUM CHLORIDE 0.9 % IR SOLN
Status: DC | PRN
  Administered 2013-06-26: 1000 mL

## 2013-06-26 MED ORDER — PROPOFOL 10 MG/ML IV BOLUS
INTRAVENOUS | Status: AC
  Filled 2013-06-26: qty 20

## 2013-06-26 MED ORDER — ATENOLOL 25 MG PO TABS
25.0000 mg | ORAL_TABLET | Freq: Once | ORAL | Status: AC
  Administered 2013-06-26: 25 mg via ORAL
  Filled 2013-06-26: qty 1

## 2013-06-26 MED ORDER — HYDROMORPHONE HCL PF 1 MG/ML IJ SOLN
0.2500 mg | INTRAMUSCULAR | Status: DC | PRN
  Administered 2013-06-26 (×2): 0.5 mg via INTRAVENOUS

## 2013-06-26 MED ORDER — OXYMETAZOLINE HCL 0.05 % NA SOLN
NASAL | Status: AC
  Filled 2013-06-26: qty 15

## 2013-06-26 MED ORDER — CALCIUM CHLORIDE 10 % IV SOLN
INTRAVENOUS | Status: DC | PRN
  Administered 2013-06-26 (×2): 200 mg via INTRAVENOUS

## 2013-06-26 MED ORDER — EPHEDRINE SULFATE 50 MG/ML IJ SOLN
INTRAMUSCULAR | Status: AC
  Filled 2013-06-26: qty 1

## 2013-06-26 MED ORDER — PHENYLEPHRINE HCL 10 MG/ML IJ SOLN
INTRAMUSCULAR | Status: DC | PRN
  Administered 2013-06-26 (×2): 40 ug via INTRAVENOUS
  Administered 2013-06-26 (×4): 80 ug via INTRAVENOUS

## 2013-06-26 MED ORDER — LIDOCAINE HCL (CARDIAC) 20 MG/ML IV SOLN
INTRAVENOUS | Status: DC | PRN
  Administered 2013-06-26: 100 mg via INTRAVENOUS

## 2013-06-26 MED ORDER — OXYCODONE-ACETAMINOPHEN 10-325 MG PO TABS: 1.0000 | ORAL_TABLET | ORAL | Status: AC | PRN

## 2013-06-26 MED ORDER — EPHEDRINE SULFATE 50 MG/ML IJ SOLN
INTRAMUSCULAR | Status: DC | PRN
  Administered 2013-06-26 (×3): 10 mg via INTRAVENOUS
  Administered 2013-06-26: 15 mg via INTRAVENOUS
  Administered 2013-06-26: 5 mg via INTRAVENOUS

## 2013-06-26 MED ORDER — FENTANYL CITRATE 0.05 MG/ML IJ SOLN
INTRAMUSCULAR | Status: AC
  Filled 2013-06-26: qty 5

## 2013-06-26 MED ORDER — 0.9 % SODIUM CHLORIDE (POUR BTL) OPTIME
TOPICAL | Status: DC | PRN
  Administered 2013-06-26: 1000 mL

## 2013-06-26 MED ORDER — LIDOCAINE-EPINEPHRINE 2 %-1:100000 IJ SOLN
INTRAMUSCULAR | Status: DC | PRN
  Administered 2013-06-26: 19 mL

## 2013-06-26 MED ORDER — PHENYLEPHRINE 40 MCG/ML (10ML) SYRINGE FOR IV PUSH (FOR BLOOD PRESSURE SUPPORT)
PREFILLED_SYRINGE | INTRAVENOUS | Status: AC
  Filled 2013-06-26: qty 10

## 2013-06-26 MED ORDER — LACTATED RINGERS IV SOLN
INTRAVENOUS | Status: DC | PRN
  Administered 2013-06-26 (×2): via INTRAVENOUS

## 2013-06-26 MED ORDER — FENTANYL CITRATE 0.05 MG/ML IJ SOLN
INTRAMUSCULAR | Status: DC | PRN
  Administered 2013-06-26: 100 ug via INTRAVENOUS

## 2013-06-26 MED ORDER — SUCCINYLCHOLINE CHLORIDE 20 MG/ML IJ SOLN
INTRAMUSCULAR | Status: DC | PRN
  Administered 2013-06-26: 100 mg via INTRAVENOUS

## 2013-06-26 MED ORDER — LIDOCAINE-EPINEPHRINE 2 %-1:100000 IJ SOLN
INTRAMUSCULAR | Status: AC
  Filled 2013-06-26: qty 1

## 2013-06-26 MED ORDER — PROPOFOL 10 MG/ML IV BOLUS
INTRAVENOUS | Status: DC | PRN
  Administered 2013-06-26: 200 mg via INTRAVENOUS

## 2013-06-26 MED ORDER — ONDANSETRON HCL 4 MG/2ML IJ SOLN
INTRAMUSCULAR | Status: DC | PRN
  Administered 2013-06-26: 4 mg via INTRAVENOUS

## 2013-06-26 MED ORDER — LIDOCAINE HCL (CARDIAC) 20 MG/ML IV SOLN
INTRAVENOUS | Status: AC
  Filled 2013-06-26: qty 5

## 2013-06-26 MED ORDER — OXYMETAZOLINE HCL 0.05 % NA SOLN
NASAL | Status: DC | PRN
  Administered 2013-06-26: 2 via NASAL

## 2013-06-26 MED ORDER — ARTIFICIAL TEARS OP OINT
TOPICAL_OINTMENT | OPHTHALMIC | Status: DC | PRN
  Administered 2013-06-26: 1 via OPHTHALMIC

## 2013-06-26 MED ORDER — MIDAZOLAM HCL 2 MG/2ML IJ SOLN
INTRAMUSCULAR | Status: AC
  Filled 2013-06-26: qty 2

## 2013-06-26 MED ORDER — ONDANSETRON HCL 4 MG/2ML IJ SOLN: 4.0000 mg | Freq: Once | INTRAMUSCULAR | Status: DC | PRN

## 2013-06-26 SURGICAL SUPPLY — 29 items
BUR CROSS CUT FISSURE 1.6 (BURR) ×2 IMPLANT
BUR CROSS CUT FISSURE 1.6MM (BURR) ×1
BUR EGG ELITE 4.0 (BURR) ×2 IMPLANT
BUR EGG ELITE 4.0MM (BURR) ×1
CANISTER SUCTION 2500CC (MISCELLANEOUS) ×3 IMPLANT
COVER SURGICAL LIGHT HANDLE (MISCELLANEOUS) ×3 IMPLANT
CRADLE DONUT ADULT HEAD (MISCELLANEOUS) ×3 IMPLANT
DECANTER SPIKE VIAL GLASS SM (MISCELLANEOUS) ×3 IMPLANT
GAUZE PACKING FOLDED 2  STR (GAUZE/BANDAGES/DRESSINGS) ×2
GAUZE PACKING FOLDED 2 STR (GAUZE/BANDAGES/DRESSINGS) ×1 IMPLANT
GLOVE BIO SURGEON STRL SZ 6.5 (GLOVE) ×2 IMPLANT
GLOVE BIO SURGEON STRL SZ7.5 (GLOVE) ×3 IMPLANT
GLOVE BIO SURGEONS STRL SZ 6.5 (GLOVE) ×1
GLOVE BIOGEL PI IND STRL 7.0 (GLOVE) ×1 IMPLANT
GLOVE BIOGEL PI INDICATOR 7.0 (GLOVE) ×2
GOWN STRL NON-REIN LRG LVL3 (GOWN DISPOSABLE) ×3 IMPLANT
GOWN STRL REIN XL XLG (GOWN DISPOSABLE) ×3 IMPLANT
KIT BASIN OR (CUSTOM PROCEDURE TRAY) ×3 IMPLANT
KIT ROOM TURNOVER OR (KITS) ×3 IMPLANT
NEEDLE 22X1 1/2 (OR ONLY) (NEEDLE) ×3 IMPLANT
NS IRRIG 1000ML POUR BTL (IV SOLUTION) ×3 IMPLANT
PAD ARMBOARD 7.5X6 YLW CONV (MISCELLANEOUS) ×6 IMPLANT
SOLUTION BETADINE 4OZ (MISCELLANEOUS) IMPLANT
SUT CHROMIC 3 0 PS 2 (SUTURE) ×3 IMPLANT
SYR CONTROL 10ML LL (SYRINGE) ×3 IMPLANT
TOWEL OR 17X26 10 PK STRL BLUE (TOWEL DISPOSABLE) ×3 IMPLANT
TRAY ENT MC OR (CUSTOM PROCEDURE TRAY) ×3 IMPLANT
TUBING IRRIGATION (MISCELLANEOUS) IMPLANT
YANKAUER SUCT BULB TIP NO VENT (SUCTIONS) ×3 IMPLANT

## 2013-06-26 NOTE — Transfer of Care (Signed)
Immediate Anesthesia Transfer of Care Note  Patient: Brian Becker  Procedure(s) Performed: Procedure(s): MULTIPLE EXTRACTIONS  WITH ALVEOLOPLASTY (N/A)  Patient Location: PACU  Anesthesia Type:General  Level of Consciousness: awake, alert  and oriented  Airway & Oxygen Therapy: Patient Spontanous Breathing and Patient connected to face mask oxygen  Post-op Assessment: Report given to PACU RN, Post -op Vital signs reviewed and stable and Patient moving all extremities  Post vital signs: Reviewed and stable  Complications: No apparent anesthesia complications

## 2013-06-26 NOTE — Discharge Instructions (Signed)
What to Eat after Tooth extraction:     For your first meals, you should eat lightly; only small meals at first.   Avoid Sharp, Crunchy, and Hot foods.   If you do not have nausea, you may eat larger meals.  Avoid spicy, greasy and heavy food, as these may make you sick after the anesthesia.    General Anesthesia, Adult, Care After  Refer to this sheet in the next few weeks. These instructions provide you with information on caring for yourself after your procedure. Your health care provider may also give you more specific instructions. Your treatment has been planned according to current medical practices, but problems sometimes occur. Call your health care provider if you have any problems or questions after your procedure.  WHAT TO EXPECT AFTER THE PROCEDURE  After the procedure, it is typical to experience:  Sleepiness.  Nausea and vomiting. HOME CARE INSTRUCTIONS  For the first 24 hours after general anesthesia:  Have a responsible person with you.  Do not drive a car. If you are alone, do not take public transportation.  Do not drink alcohol.  Do not take medicine that has not been prescribed by your health care provider.  Do not sign important papers or make important decisions.  You may resume a normal diet and activities as directed by your health care provider.  Change bandages (dressings) as directed.  If you have questions or problems that seem related to general anesthesia, call the hospital and ask for the anesthetist or anesthesiologist on call. SEEK MEDICAL CARE IF:  You have nausea and vomiting that continue the day after anesthesia.  You develop a rash. SEEK IMMEDIATE MEDICAL CARE IF:  You have difficulty breathing.  You have chest pain.  You have any allergic problems. Document Released: 07/26/2000 Document Revised: 12/20/2012 Document Reviewed: 11/02/2012  [facility] Patient Information 2014 [place], [place].

## 2013-06-26 NOTE — Op Note (Signed)
06/26/2013  10:34 AM  PATIENT:  Brian Becker  78 y.o. male  PRE-OPERATIVE DIAGNOSIS:   NON RESTORABLE TEETH #'s 1, 4, 6, 7, 8, 9, 10, 11, 17, 18, 20, 21, 22, 23, 24, 25, 26, 27, 28, 29, 31, 32,  POST-OPERATIVE DIAGNOSIS:  SAME+ Oral antral communication tooth # 1,   PROCEDURE:  Procedure(s): MULTIPLE EXTRACTIONS TEETH #'s 1, 4, 6, 7, 8, 9, 10, 11, 17, 18, 20, 21, 22, 23, 24, 25, 26, 27, 28, 29, 31, 32, WITH ALVEOLOPLASTY, closure oral antral communication  Right  maxilla  SURGEON:  Surgeon(s): Gae Bon, DDS  ANESTHESIA:   local and general  EBL:  minimal  DRAINS: none   SPECIMEN:  No Specimen  COUNTS:  YES  PLAN OF CARE: Discharge to home after PACU  PATIENT DISPOSITION:  PACU - hemodynamically stable.   PROCEDURE DETAILS: Dictation #  Gae Bon, DMD 06/26/2013 10:34 AM

## 2013-06-26 NOTE — Anesthesia Postprocedure Evaluation (Signed)
  Anesthesia Post-op Note  Patient: Brian Becker  Procedure(s) Performed: Procedure(s): MULTIPLE EXTRACTIONS  WITH ALVEOLOPLASTY (N/A)  Patient Location: PACU  Anesthesia Type:General  Level of Consciousness: awake, alert , oriented and patient cooperative  Airway and Oxygen Therapy: Patient Spontanous Breathing  Post-op Pain: mild  Post-op Assessment: Post-op Vital signs reviewed, Patient's Cardiovascular Status Stable, Respiratory Function Stable, Patent Airway, No signs of Nausea or vomiting and Pain level controlled  Post-op Vital Signs: stable  Complications: No apparent anesthesia complications

## 2013-06-26 NOTE — Anesthesia Preprocedure Evaluation (Signed)
Anesthesia Evaluation  Patient identified by MRN, date of birth, ID band Patient awake    Reviewed: Allergy & Precautions, H&P , NPO status , Patient's Chart, lab work & pertinent test results  Airway       Dental   Pulmonary former smoker,          Cardiovascular hypertension, + CAD and + CABG     Neuro/Psych  Neuromuscular disease    GI/Hepatic   Endo/Other    Renal/GU Renal InsufficiencyRenal disease     Musculoskeletal  (+) Fibromyalgia -  Abdominal   Peds  Hematology   Anesthesia Other Findings   Reproductive/Obstetrics                           Anesthesia Physical Anesthesia Plan  ASA: III  Anesthesia Plan: General   Post-op Pain Management:    Induction: Intravenous  Airway Management Planned: Nasal ETT  Additional Equipment:   Intra-op Plan:   Post-operative Plan: Extubation in OR  Informed Consent: I have reviewed the patients History and Physical, chart, labs and discussed the procedure including the risks, benefits and alternatives for the proposed anesthesia with the patient or authorized representative who has indicated his/her understanding and acceptance.     Plan Discussed with:   Anesthesia Plan Comments:         Anesthesia Quick Evaluation

## 2013-06-26 NOTE — Progress Notes (Signed)
Report given to maria rn as caregiver 

## 2013-06-26 NOTE — Op Note (Signed)
NAME:  TYTAN, SANDATE NO.:  [id number]  MEDICAL RECORD NO.:  [id number]  LOCATION:  [facility]                         FACILITY:  [facility]  PHYSICIAN:  Gae Bon, M.D.  DATE OF BIRTH:  [birth year]  DATE OF PROCEDURE:  06/26/2013 DATE OF DISCHARGE:                              OPERATIVE REPORT   PREOPERATIVE DIAGNOSIS:  Nonrestorable teeth #1, #4, #6, #7, #8, #9, #10, #11, #17, #18, #20, #21, #22, #23, #24, #25, #26, #27, #28, #29, #31, #32 secondary to dental caries and severe chronic generalized periodontitis.  POSTOPERATIVE DIAGNOSES: 1. Oral antral communication, #16. 2. Nonrestorable teeth #1, #4, #6, #7, #8, #9, #10, #11, #17, #18,     #20, #21, #22, #23, #24, #25, #26, #27, #28, #29, #31, #32     secondary to dental caries and severe chronic generalized     periodontitis.  PROCEDURES:  Multiple extractions teeth #1, #4, #6, #7, #8, #9, #10, #11, #17, #18, #20, #21, #22, #23, #24, #25, #26, #27, #28, #29, #31, #32, alveoplasty, right and left maxilla and mandible, closure of oral antral communication, right maxilla.  SURGEON:  Gae Bon, M.D.  ANESTHESIA:  General nasal intubation.  PROCEDURE:  The patient was taken to the operating room, placed on the table in a supine position.  General anesthesia was administered intravenously and a nasal endotracheal tube was placed and secured.  The eyes were protected and the patient was draped for the procedure.  Time- out was performed.  The posterior pharynx was then suctioned and a throat pack was placed.  2% lidocaine 1:100,000 epinephrine was infiltrated in an inferior alveolar block on the right and left side and in buccal and palatal infiltration in the maxilla.  Total of 19 mL was utilized.  A bite-block was placed in the right side of the mouth and a sweetheart retractor was placed on the left side and a 15-blade was used to make an incision beginning in the left mandible with tooth #17 carrying  anteriorly to tooth #26 on the buccal and lingual surfaces of the teeth at the gingival sulcus.  The 15-blade was then used to make an incision beginning at tooth #11 and carrying anteriorly to tooth #7 on the buccal and palatal surfaces.  Then, the periosteum was reflected from around these teeth.  Teeth were elevated with a 301 elevator.  Upon beginning extraction with the lower forceps, teeth #17 and #18 fractured requiring the use of the Stryker handpiece to remove interproximal bone and dissection of the teeth.  The teeth were then removed with a 301 elevator.  The lower teeth #20, #21, #22, #23, #24, #25, #26 were removed with the Asch forceps.  In the maxilla, teeth #11, #10, #9, #8, and #7 were removed with the upper universal forceps.  Then, the sockets were curetted.  The periosteum was reflected to expose the alveolar crest in both the left maxilla and left mandible.  Then the egg-shaped bur and bone file were used to perform the alveoplasty.  Then, the areas were irrigated and closed with 3-0 chromic.  Then the bite-block and sweetheart retractor were repositioned to the other side of the mouth and a 15-blade  was used to make an incision beginning at tooth #32 carrying anteriorly to tooth #27 on the buccal and lingual surfaces in the gingival sulcus and in the maxilla beginning at tooth #1 and carrying for tooth #6.  The periosteum was reflected and then bone was removed around teeth #31 and #32 as they were not able to be removed with the dental forceps.  The teeth were sectioned and removed with 301 elevator.  Tooth #27 and #28 fractured during removal with the Asch forceps and additional interproximal bone was removed around teeth #27, #28, #29, and the teeth were removed with the Asch forceps and the rongeurs.  In the maxilla, teeth #1 and #4 fractured upon removal. Additional bone was removed with a Stryker handpiece as the roots were removed with a 301 elevator.  In the  area of tooth #1, a 5-mm bony opening was created into the maxillary sinus.  Bone fragments from the alveolar crest and septal bone were used to pack into this area, and then alveoplasty was performed using the egg-shaped bur and bone file in the maxilla and mandible on the right side.  Then, releasing incision was made in the area of tooth #1.  The periosteum was released and then the upper incision was closed with primary closure over the oral antral communication using 3-0 chromic.  In the mandible, 3-0 chromic was used to close this incision as well.  Then, the oral cavity was inspected and found to have good contour, hemostasis, and closure.  The oral cavity was irrigated, suctioned.  Throat pack was removed.  The patient was awakened, taken to the recovery room, breathing spontaneously in good condition.  EBL:  Minimum.  COMPLICATIONS:  None.     Gae Bon, M.D.     SMJ/MEDQ  D:  06/26/2013  T:  06/26/2013  Job:  [id number]

## 2013-06-26 NOTE — Anesthesia Procedure Notes (Addendum)
Procedure Name: Intubation Date/Time: 06/26/2013 9:00 AM Performed by: Izora Gala Pre-anesthesia Checklist: Patient identified, Emergency Drugs available, Suction available, Patient being monitored and Timeout performed Patient Re-evaluated:Patient Re-evaluated prior to inductionOxygen Delivery Method: Circle system utilized and Simple face mask Preoxygenation: Pre-oxygenation with 100% oxygen Intubation Type: IV induction Ventilation: Mask ventilation without difficulty Laryngoscope Size: Mac and 4 Grade View: Grade II Nasal Tubes: Right and Nasal Rae Tube size: 7.5 mm Number of attempts: 2 (First intubation attempt successful, however cuff damaged. Extubated and reintubated with new ett) Placement Confirmation: ETT inserted through vocal cords under direct vision,  positive ETCO2,  CO2 detector and breath sounds checked- equal and bilateral Secured at: 23 cm Tube secured with: Tape Dental Injury: Teeth and Oropharynx as per pre-operative assessment

## 2013-06-26 NOTE — H&P (Signed)
H&P documentation  -History and Physical Reviewed  -Patient has been re-examined  -No change in the plan of care  Brian Becker M  

## 2013-06-27 ENCOUNTER — Encounter (HOSPITAL_COMMUNITY): Payer: Self-pay | Admitting: Oral Surgery

## 2013-07-27 ENCOUNTER — Other Ambulatory Visit (HOSPITAL_COMMUNITY): Payer: Self-pay | Admitting: Cardiology

## 2014-04-11 ENCOUNTER — Encounter (HOSPITAL_COMMUNITY): Payer: Self-pay | Admitting: Cardiology

## 2015-04-07 IMAGING — CR DG CHEST 2V
2 series · 2 of 2 positions shown · non-contrast
Comparison: DG CHEST 2V dated 12/08/2011; DG CHEST 2V dated
07/20/2008; CT ANGIO CHEST dated 07/20/2008

CLINICAL DATA: Preoperative examination (oral surgery), history of
bypass

EXAM:
CHEST  2 VIEW

[w chest pa]
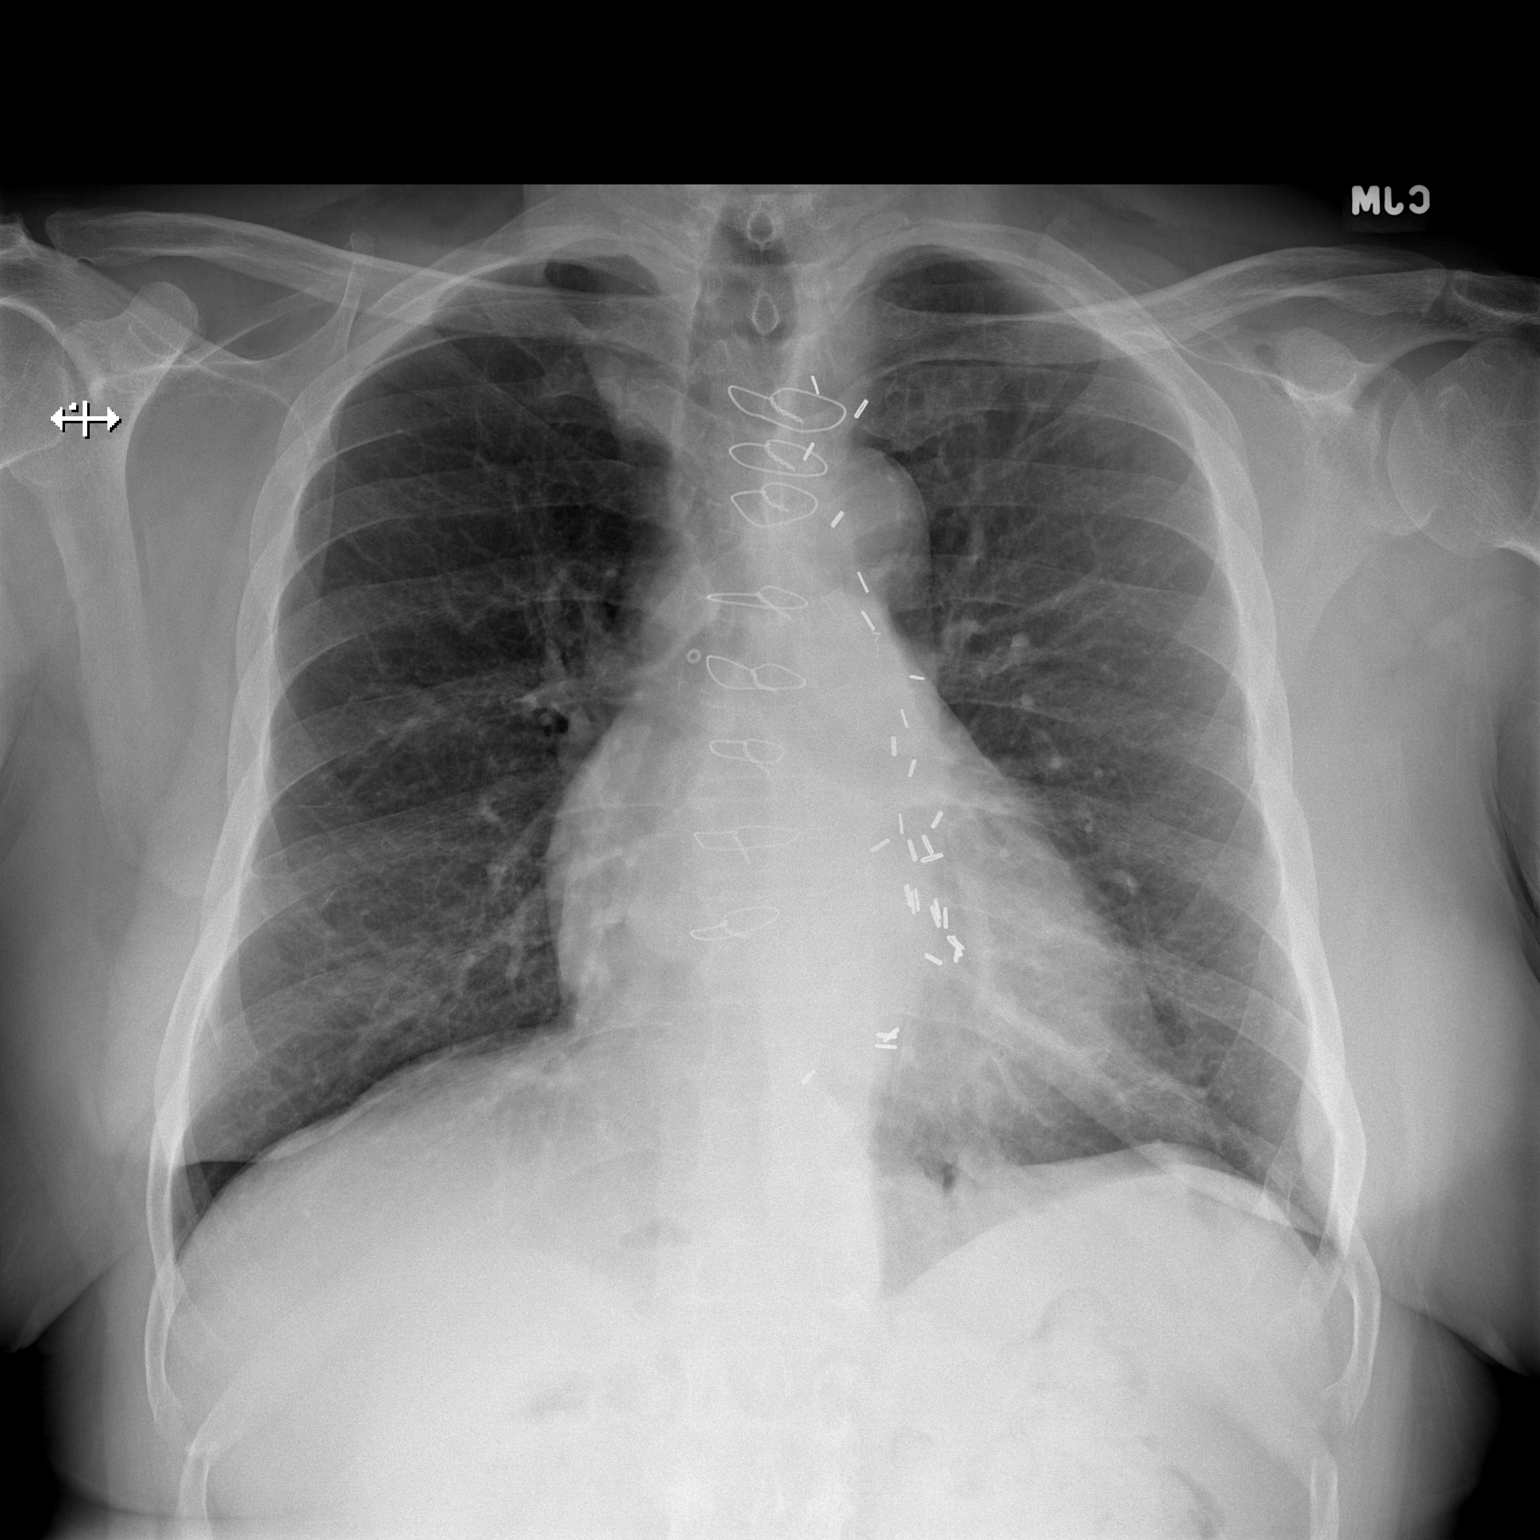

[w chest lat]
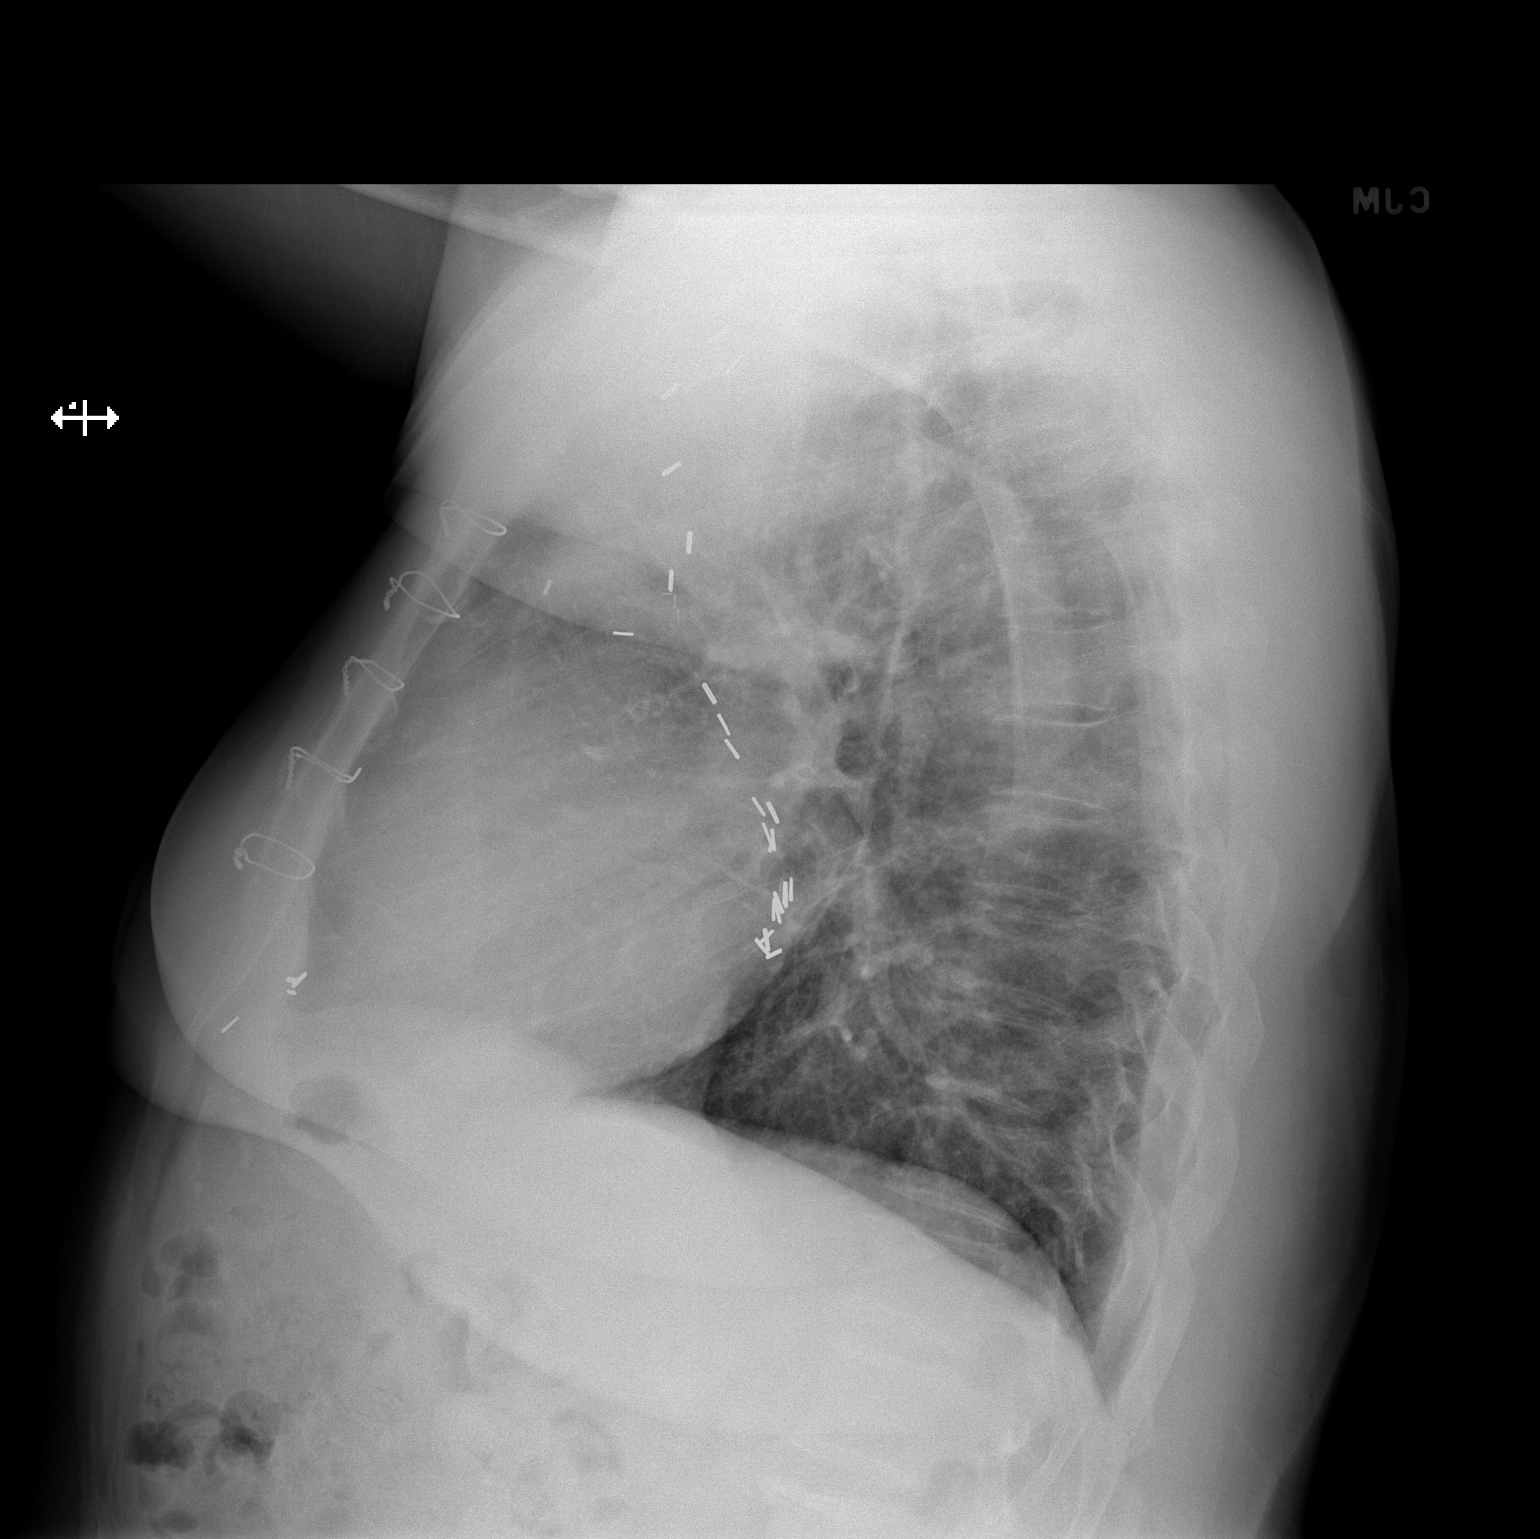

[2 of 2 positions shown; findings below may reference images not displayed]

FINDINGS: Grossly unchanged borderline enlarged cardiac silhouette. Normal
mediastinal contours. Post median sternotomy and CABG. The lungs are
hyperexpanded with flattening of the bilaterally diaphragms and mild
diffuse slightly nodular thickening of the pulmonary interstitium.
No focal airspace opacities. No pleural effusion or pneumothorax. No
evidence of edema. No acute osseus abnormalities.
IMPRESSION: Hyperexpanded lungs without acute cardiopulmonary disease.

## 2016-10-01 DEATH — deceased
